# Patient Record
Sex: Female | Born: 1975 | Race: White | Hispanic: No | Marital: Single | Smoking: Never smoker
Health system: Southern US, Community
[De-identification: ages and names within clinical notes are randomized; demographics above are authoritative.]

## PROBLEM LIST (undated history)

## (undated) DIAGNOSIS — F319 Bipolar disorder, unspecified: Secondary | ICD-10-CM

## (undated) DIAGNOSIS — F909 Attention-deficit hyperactivity disorder, unspecified type: Secondary | ICD-10-CM

## (undated) DIAGNOSIS — F429 Obsessive-compulsive disorder, unspecified: Secondary | ICD-10-CM

## (undated) DIAGNOSIS — J45909 Unspecified asthma, uncomplicated: Secondary | ICD-10-CM

## (undated) HISTORY — DX: Attention-deficit hyperactivity disorder, unspecified type: F90.9

---

## 2013-08-23 ENCOUNTER — Encounter (INDEPENDENT_AMBULATORY_CARE_PROVIDER_SITE_OTHER): Payer: Self-pay

## 2013-08-23 ENCOUNTER — Ambulatory Visit (INDEPENDENT_AMBULATORY_CARE_PROVIDER_SITE_OTHER): Payer: Medicaid Other | Admitting: Nurse Practitioner

## 2013-08-23 ENCOUNTER — Encounter: Payer: Self-pay | Admitting: Nurse Practitioner

## 2013-08-23 VITALS — BP 116/77 | HR 84 | Temp 97.3°F | Ht 62.0 in | Wt 185.0 lb

## 2013-08-23 DIAGNOSIS — F3162 Bipolar disorder, current episode mixed, moderate: Secondary | ICD-10-CM | POA: Insufficient documentation

## 2013-08-23 DIAGNOSIS — F902 Attention-deficit hyperactivity disorder, combined type: Secondary | ICD-10-CM

## 2013-08-23 DIAGNOSIS — F909 Attention-deficit hyperactivity disorder, unspecified type: Secondary | ICD-10-CM

## 2013-08-23 MED ORDER — LISDEXAMFETAMINE DIMESYLATE 40 MG PO CAPS: 40.0000 mg | ORAL_CAPSULE | ORAL | Status: DC

## 2013-08-23 NOTE — Progress Notes (Signed)
   Subjective:    Patient ID: Ellen Walter, female    DOB: 07-Apr-1976, 37 y.o.   MRN: [id number]  HPI Patient here to establish care- She has moved here from [place]- Was on ritalin 20mg  BID- SHe says that she has been on it for about 10 years- during that time she has been off of it a couple of times. Does much better when on meds. Old records have been reviewed.    Review of Systems  Constitutional: Negative.   HENT: Negative.   Respiratory: Negative.   Cardiovascular: Negative.   Gastrointestinal: Negative.   Psychiatric/Behavioral: The patient is hyperactive. The patient is not nervous/anxious.   All other systems reviewed and are negative.       Objective:   Physical Exam  Constitutional: She is oriented to person, place, and time. She appears well-developed and well-nourished.  Cardiovascular: Normal rate, regular rhythm and normal heart sounds.   Pulmonary/Chest: Effort normal and breath sounds normal.  Neurological: She is alert and oriented to person, place, and time.  Psychiatric: She has a normal mood and affect. Her behavior is normal. Judgment and thought content normal.   BP 116/77  Pulse 84  Temp(Src) 97.3 F (36.3 C) (Oral)  Ht 5\' 2"  (1.575 m)  Wt 185 lb (83.915 kg)  BMI 33.83 kg/m2  LMP 06/19/2013        Assessment & Plan:   1. ADHD (attention deficit hyperactivity disorder), combined type    Meds ordered this encounter  Medications  . DISCONTD: methylphenidate (RITALIN) 20 MG tablet    Sig: Take 20 mg by mouth 2 (two) times daily with breakfast and lunch.  . lisdexamfetamine (VYVANSE) 40 MG capsule    Sig: Take 1 capsule (40 mg total) by mouth every morning.    Dispense:  30 capsule    Refill:  0    Order Specific Question:  Supervising Provider    Answer:  Deborra Medina   Vyvanse much safer drug for adult Stress management Follow up in 1 month  Ellen Daphine Deutscher, FNP

## 2013-08-23 NOTE — Patient Instructions (Signed)
Attention Deficit Hyperactivity Disorder Attention deficit hyperactivity disorder (ADHD) is a problem with behavior issues based on the way the brain functions (neurobehavioral disorder). It is a common reason for behavior and academic problems in school. CAUSES  The cause of ADHD is unknown in most cases. It may run in families. It sometimes can be associated with learning disabilities and other behavioral problems. SYMPTOMS  There are 3 types of ADHD. The 3 types and some of the symptoms include:  Inattentive  Gets bored or distracted easily.  Loses or forgets things. Forgets to hand in homework.  Has trouble organizing or completing tasks.  Difficulty staying on task.  An inability to organize daily tasks and school work.  Leaving projects, chores, or homework unfinished.  Trouble paying attention or responding to details. Careless mistakes.  Difficulty following directions. Often seems like is not listening.  Dislikes activities that require sustained attention (like chores or homework).  Hyperactive-impulsive  Feels like it is impossible to sit still or stay in a seat. Fidgeting with hands and feet.  Trouble waiting turn.  Talking too much or out of turn. Interruptive.  Speaks or acts impulsively.  Aggressive, disruptive behavior.  Constantly busy or on the go, noisy.  Combined  Has symptoms of both of the above. Often children with ADHD feel discouraged about themselves and with school. They often perform well below their abilities in school. These symptoms can cause problems in home, school, and in relationships with peers. As children get older, the excess motor activities can calm down, but the problems with paying attention and staying organized persist. Most children do not outgrow ADHD but with good treatment can learn to cope with the symptoms. DIAGNOSIS  When ADHD is suspected, the diagnosis should be made by professionals trained in ADHD.  Diagnosis will  include:  Ruling out other reasons for the child's behavior.  The caregivers will check with the child's school and check their medical records.  They will talk to teachers and parents.  Behavior rating scales for the child will be filled out by those dealing with the child on a daily basis. A diagnosis is made only after all information has been considered. TREATMENT  Treatment usually includes behavioral treatment often along with medicines. It may include stimulant medicines. The stimulant medicines decrease impulsivity and hyperactivity and increase attention. Other medicines used include antidepressants and certain blood pressure medicines. Most experts agree that treatment for ADHD should address all aspects of the child's functioning. Treatment should not be limited to the use of medicines alone. Treatment should include structured classroom management. The parents must receive education to address rewarding good behavior, discipline, and limit-setting. Tutoring or behavioral therapy or both should be available for the child. If untreated, the disorder can have long-term serious effects into adolescence and adulthood. HOME CARE INSTRUCTIONS   Often with ADHD there is a lot of frustration among the family in dealing with the illness. There is often blame and anger that is not warranted. This is a life long illness. There is no way to prevent ADHD. In many cases, because the problem affects the family as a whole, the entire family may need help. A therapist can help the family find better ways to handle the disruptive behaviors and promote change. If the child is young, most of the therapist's work is with the parents. Parents will learn techniques for coping with and improving their child's behavior. Sometimes only the child with the ADHD needs counseling. Your caregivers can help   you make these decisions.  Children with ADHD may need help in organizing. Some helpful tips include:  Keep  routines the same every day from wake-up time to bedtime. Schedule everything. This includes homework and playtime. This should include outdoor and indoor recreation. Keep the schedule on the refrigerator or a bulletin board where it is frequently seen. Mark schedule changes as far in advance as possible.  Have a place for everything and keep everything in its place. This includes clothing, backpacks, and school supplies.  Encourage writing down assignments and bringing home needed books.  Offer your child a well-balanced diet. Breakfast is especially important for school performance. Children should avoid drinks with caffeine including:  Soft drinks.  Coffee.  Tea.  However, some older children (adolescents) may find these drinks helpful in improving their attention.  Children with ADHD need consistent rules that they can understand and follow. If rules are followed, give small rewards. Children with ADHD often receive, and expect, criticism. Look for good behavior and praise it. Set realistic goals. Give clear instructions. Look for activities that can foster success and self-esteem. Make time for pleasant activities with your child. Give lots of affection.  Parents are their children's greatest advocates. Learn as much as possible about ADHD. This helps you become a stronger and better advocate for your child. It also helps you educate your child's teachers and instructors if they feel inadequate in these areas. Parent support groups are often helpful. A national group with local chapters is called CHADD (Children and Adults with Attention Deficit Hyperactivity Disorder). PROGNOSIS  There is no cure for ADHD. Children with the disorder seldom outgrow it. Many find adaptive ways to accommodate the ADHD as they mature. SEEK MEDICAL CARE IF:  Your child has repeated muscle twitches, cough or speech outbursts.  Your child has sleep problems.  Your child has a marked loss of  appetite.  Your child develops depression.  Your child has new or worsening behavioral problems.  Your child develops dizziness.  Your child has a racing heart.  Your child has stomach pains.  Your child develops headaches. Document Released: 08/14/2002 Document Revised: 11/16/2011 Document Reviewed: 03/15/2013 [employer] Patient Information 2014 [employer].  

## 2013-09-25 ENCOUNTER — Ambulatory Visit: Payer: Medicaid Other | Admitting: Nurse Practitioner

## 2013-10-16 ENCOUNTER — Encounter: Payer: Self-pay | Admitting: Nurse Practitioner

## 2013-10-16 ENCOUNTER — Ambulatory Visit (INDEPENDENT_AMBULATORY_CARE_PROVIDER_SITE_OTHER): Payer: Medicaid Other | Admitting: Nurse Practitioner

## 2013-10-16 VITALS — BP 137/91 | HR 81 | Temp 97.6°F | Ht 62.0 in | Wt 190.0 lb

## 2013-10-16 DIAGNOSIS — F902 Attention-deficit hyperactivity disorder, combined type: Secondary | ICD-10-CM

## 2013-10-16 DIAGNOSIS — S139XXA Sprain of joints and ligaments of unspecified parts of neck, initial encounter: Secondary | ICD-10-CM

## 2013-10-16 DIAGNOSIS — S161XXA Strain of muscle, fascia and tendon at neck level, initial encounter: Secondary | ICD-10-CM

## 2013-10-16 DIAGNOSIS — F909 Attention-deficit hyperactivity disorder, unspecified type: Secondary | ICD-10-CM

## 2013-10-16 NOTE — Patient Instructions (Signed)
Cervical Sprain A cervical sprain is an injury in the neck in which the strong, fibrous tissues (ligaments) that connect your neck bones stretch or tear. Cervical sprains can range from mild to severe. Severe cervical sprains can cause the neck vertebrae to be unstable. This can lead to damage of the spinal cord and can result in serious nervous system problems. The amount of time it takes for a cervical sprain to get better depends on the cause and extent of the injury. Most cervical sprains heal in 1 to 3 weeks. CAUSES  Severe cervical sprains may be caused by:   Contact sport injuries (such as from football, rugby, wrestling, hockey, auto racing, gymnastics, diving, martial arts, or boxing).   Motor vehicle collisions.   Whiplash injuries. This is an injury from a sudden forward-and backward whipping movement of the head and neck.  Falls.  Mild cervical sprains may be caused by:   Being in an awkward position, such as while cradling a telephone between your ear and shoulder.   Sitting in a chair that does not offer proper support.   Working at a poorly designed computer station.   Looking up or down for long periods of time.  SYMPTOMS   Pain, soreness, stiffness, or a burning sensation in the front, back, or sides of the neck. This discomfort may develop immediately after the injury or slowly, 24 hours or more after the injury.   Pain or tenderness directly in the middle of the back of the neck.   Shoulder or upper back pain.   Limited ability to move the neck.   Headache.   Dizziness.   Weakness, numbness, or tingling in the hands or arms.   Muscle spasms.   Difficulty swallowing or chewing.   Tenderness and swelling of the neck.  DIAGNOSIS  Most of the time your health care provider can diagnose a cervical sprain by taking your history and doing a physical exam. Your health care provider will ask about previous neck injuries and any known neck  problems, such as arthritis in the neck. X-rays may be taken to find out if there are any other problems, such as with the bones of the neck. Other tests, such as a CT scan or MRI, may also be needed.  TREATMENT  Treatment depends on the severity of the cervical sprain. Mild sprains can be treated with rest, keeping the neck in place (immobilization), and pain medicines. Severe cervical sprains are immediately immobilized. Further treatment is done to help with pain, muscle spasms, and other symptoms and may include:  Medicines, such as pain relievers, numbing medicines, or muscle relaxants.   Physical therapy. This may involve stretching exercises, strengthening exercises, and posture training. Exercises and improved posture can help stabilize the neck, strengthen muscles, and help stop symptoms from returning.  HOME CARE INSTRUCTIONS   Put ice on the injured area.   Put ice in a plastic bag.   Place a towel between your skin and the bag.   Leave the ice on for 15 20 minutes, 3 4 times a day.   If your injury was severe, you may have been given a cervical collar to wear. A cervical collar is a two-piece collar designed to keep your neck from moving while it heals.  Do not remove the collar unless instructed by your health care provider.  If you have long hair, keep it outside of the collar.  Ask your health care provider before making any adjustments to your collar.   Minor adjustments may be required over time to improve comfort and reduce pressure on your chin or on the back of your head.  Ifyou are allowed to remove the collar for cleaning or bathing, follow your health care provider's instructions on how to do so safely.  Keep your collar clean by wiping it with mild soap and water and drying it completely. If the collar you have been given includes removable pads, remove them every 1 2 days and hand wash them with soap and water. Allow them to air dry. They should be completely  dry before you wear them in the collar.  If you are allowed to remove the collar for cleaning and bathing, wash and dry the skin of your neck. Check your skin for irritation or sores. If you see any, tell your health care provider.  Do not drive while wearing the collar.   Only take over-the-counter or prescription medicines for pain, discomfort, or fever as directed by your health care provider.   Keep all follow-up appointments as directed by your health care provider.   Keep all physical therapy appointments as directed by your health care provider.   Make any needed adjustments to your workstation to promote good posture.   Avoid positions and activities that make your symptoms worse.   Warm up and stretch before being active to help prevent problems.  SEEK MEDICAL CARE IF:   Your pain is not controlled with medicine.   You are unable to decrease your pain medicine over time as planned.   Your activity level is not improving as expected.  SEEK IMMEDIATE MEDICAL CARE IF:   You develop any bleeding.  You develop stomach upset.  You have signs of an allergic reaction to your medicine.   Your symptoms get worse.   You develop new, unexplained symptoms.   You have numbness, tingling, weakness, or paralysis in any part of your body.  MAKE SURE YOU:   Understand these instructions.  Will watch your condition.  Will get help right away if you are not doing well or get worse. Document Released: 06/21/2007 Document Revised: 06/14/2013 Document Reviewed: 03/01/2013 [employer] Patient Information 2014 [employer].  
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
## 2014-02-08 ENCOUNTER — Emergency Department (HOSPITAL_COMMUNITY)
Admission: EM | Admit: 2014-02-08 | Discharge: 2014-02-08 | Disposition: A | Discharge status: Home / Self Care | Payer: Medicaid Other | Attending: Emergency Medicine | Admitting: Emergency Medicine

## 2014-02-08 ENCOUNTER — Encounter (HOSPITAL_COMMUNITY): Payer: Self-pay | Admitting: Emergency Medicine

## 2014-02-08 DIAGNOSIS — G8929 Other chronic pain: Secondary | ICD-10-CM | POA: Insufficient documentation

## 2014-02-08 DIAGNOSIS — J45901 Unspecified asthma with (acute) exacerbation: Secondary | ICD-10-CM | POA: Insufficient documentation

## 2014-02-08 DIAGNOSIS — Z8659 Personal history of other mental and behavioral disorders: Secondary | ICD-10-CM | POA: Insufficient documentation

## 2014-02-08 DIAGNOSIS — R519 Headache, unspecified: Secondary | ICD-10-CM

## 2014-02-08 DIAGNOSIS — R51 Headache: Secondary | ICD-10-CM | POA: Insufficient documentation

## 2014-02-08 DIAGNOSIS — Z760 Encounter for issue of repeat prescription: Secondary | ICD-10-CM | POA: Insufficient documentation

## 2014-02-08 HISTORY — DX: Unspecified asthma, uncomplicated: J45.909

## 2014-02-08 MED ORDER — ALBUTEROL SULFATE HFA 108 (90 BASE) MCG/ACT IN AERS: 1.0000 | INHALATION_SPRAY | Freq: Four times a day (QID) | RESPIRATORY_TRACT | Status: AC | PRN

## 2014-02-08 MED ORDER — ACETAMINOPHEN 325 MG PO TABS
650.0000 mg | ORAL_TABLET | Freq: Once | ORAL | Status: AC
  Administered 2014-02-08: 650 mg via ORAL
  Filled 2014-02-08: qty 2

## 2014-02-08 MED ORDER — BUTALBITAL-APAP-CAFFEINE 50-325-40 MG PO TABS: 1.0000 | ORAL_TABLET | Freq: Four times a day (QID) | ORAL | Status: DC | PRN

## 2014-02-08 MED ORDER — ALBUTEROL SULFATE HFA 108 (90 BASE) MCG/ACT IN AERS
2.0000 | INHALATION_SPRAY | RESPIRATORY_TRACT | Status: DC | PRN
  Administered 2014-02-08: 2 via RESPIRATORY_TRACT
  Filled 2014-02-08: qty 6.7

## 2014-02-08 NOTE — ED Notes (Signed)
PT now reports she has had a H/A for days and days with a pain scale of 10/10. Pt denies injury or fall. Pt A/O x4.

## 2014-02-08 NOTE — ED Notes (Signed)
Pt reports having asthma, increase in sob x 3 days and pt is out of inhalers. Airway intact at triage, speaking in full sentences.

## 2014-02-08 NOTE — Discharge Instructions (Signed)
Headaches, Frequently Asked Questions °MIGRAINE HEADACHES °Q: What is migraine? What causes it? How can I treat it? °A: Generally, migraine headaches begin as a dull ache. Then they develop into a constant, throbbing, and pulsating pain. You may experience pain at the temples. You may experience pain at the front or back of one or both sides of the head. The pain is usually accompanied by a combination of: °· Nausea. °· Vomiting. °· Sensitivity to light and noise. °Some people (about 15%) experience an aura (see below) before an attack. The cause of migraine is believed to be chemical reactions in the brain. Treatment for migraine may include over-the-counter or prescription medications. It may also include self-help techniques. These include relaxation training and biofeedback.  °Q: What is an aura? °A: About 15% of people with migraine get an "aura". This is a sign of neurological symptoms that occur before a migraine headache. You may see wavy or jagged lines, dots, or flashing lights. You might experience tunnel vision or blind spots in one or both eyes. The aura can include visual or auditory hallucinations (something imagined). It may include disruptions in smell (such as strange odors), taste or touch. Other symptoms include: °· Numbness. °· A "pins and needles" sensation. °· Difficulty in recalling or speaking the correct word. °These neurological events may last as long as 60 minutes. These symptoms will fade as the headache begins. °Q: What is a trigger? °A: Certain physical or environmental factors can lead to or "trigger" a migraine. These include: °· Foods. °· Hormonal changes. °· Weather. °· Stress. °It is important to remember that triggers are different for everyone. To help prevent migraine attacks, you need to figure out which triggers affect you. Keep a headache diary. This is a good way to track triggers. The diary will help you talk to your healthcare professional about your condition. °Q: Does  weather affect migraines? °A: Bright sunshine, hot, humid conditions, and drastic changes in barometric pressure may lead to, or "trigger," a migraine attack in some people. But studies have shown that weather does not act as a trigger for everyone with migraines. °Q: What is the link between migraine and hormones? °A: Hormones start and regulate many of your body's functions. Hormones keep your body in balance within a constantly changing environment. The levels of hormones in your body are unbalanced at times. Examples are during menstruation, pregnancy, or menopause. That can lead to a migraine attack. In fact, about three quarters of all women with migraine report that their attacks are related to the menstrual cycle.  °Q: Is there an increased risk of stroke for migraine sufferers? °A: The likelihood of a migraine attack causing a stroke is very remote. That is not to say that migraine sufferers cannot have a stroke associated with their migraines. In persons under age 40, the most common associated factor for stroke is migraine headache. But over the course of a person's normal life span, the occurrence of migraine headache may actually be associated with a reduced risk of dying from cerebrovascular disease due to stroke.  °Q: What are acute medications for migraine? °A: Acute medications are used to treat the pain of the headache after it has started. Examples over-the-counter medications, NSAIDs, ergots, and triptans.  °Q: What are the triptans? °A: Triptans are the newest class of abortive medications. They are specifically targeted to treat migraine. Triptans are vasoconstrictors. They moderate some chemical reactions in the brain. The triptans work on receptors in your brain. Triptans help   to restore the balance of a neurotransmitter called serotonin. Fluctuations in levels of serotonin are thought to be a main cause of migraine.  °Q: Are over-the-counter medications for migraine effective? °A:  Over-the-counter, or "OTC," medications may be effective in relieving mild to moderate pain and associated symptoms of migraine. But you should see your caregiver before beginning any treatment regimen for migraine.  °Q: What are preventive medications for migraine? °A: Preventive medications for migraine are sometimes referred to as "prophylactic" treatments. They are used to reduce the frequency, severity, and length of migraine attacks. Examples of preventive medications include antiepileptic medications, antidepressants, beta-blockers, calcium channel blockers, and NSAIDs (nonsteroidal anti-inflammatory drugs). °Q: Why are anticonvulsants used to treat migraine? °A: During the past few years, there has been an increased interest in antiepileptic drugs for the prevention of migraine. They are sometimes referred to as "anticonvulsants". Both epilepsy and migraine may be caused by similar reactions in the brain.  °Q: Why are antidepressants used to treat migraine? °A: Antidepressants are typically used to treat people with depression. They may reduce migraine frequency by regulating chemical levels, such as serotonin, in the brain.  °Q: What alternative therapies are used to treat migraine? °A: The term "alternative therapies" is often used to describe treatments considered outside the scope of conventional Western medicine. Examples of alternative therapy include acupuncture, acupressure, and yoga. Another common alternative treatment is herbal therapy. Some herbs are believed to relieve headache pain. Always discuss alternative therapies with your caregiver before proceeding. Some herbal products contain arsenic and other toxins. °TENSION HEADACHES °Q: What is a tension-type headache? What causes it? How can I treat it? °A: Tension-type headaches occur randomly. They are often the result of temporary stress, anxiety, fatigue, or anger. Symptoms include soreness in your temples, a tightening band-like sensation  around your head (a "vice-like" ache). Symptoms can also include a pulling feeling, pressure sensations, and contracting head and neck muscles. The headache begins in your forehead, temples, or the back of your head and neck. Treatment for tension-type headache may include over-the-counter or prescription medications. Treatment may also include self-help techniques such as relaxation training and biofeedback. °CLUSTER HEADACHES °Q: What is a cluster headache? What causes it? How can I treat it? °A: Cluster headache gets its name because the attacks come in groups. The pain arrives with little, if any, warning. It is usually on one side of the head. A tearing or bloodshot eye and a runny nose on the same side of the headache may also accompany the pain. Cluster headaches are believed to be caused by chemical reactions in the brain. They have been described as the most severe and intense of any headache type. Treatment for cluster headache includes prescription medication and oxygen. °SINUS HEADACHES °Q: What is a sinus headache? What causes it? How can I treat it? °A: When a cavity in the bones of the face and skull (a sinus) becomes inflamed, the inflammation will cause localized pain. This condition is usually the result of an allergic reaction, a tumor, or an infection. If your headache is caused by a sinus blockage, such as an infection, you will probably have a fever. An x-ray will confirm a sinus blockage. Your caregiver's treatment might include antibiotics for the infection, as well as antihistamines or decongestants.  °REBOUND HEADACHES °Q: What is a rebound headache? What causes it? How can I treat it? °A: A pattern of taking acute headache medications too often can lead to a condition known as "rebound headache."   A pattern of taking too much headache medication includes taking it more than 2 days per week or in excessive amounts. That means more than the label or a caregiver advises. With rebound  headaches, your medications not only stop relieving pain, they actually begin to cause headaches. Doctors treat rebound headache by tapering the medication that is being overused. Sometimes your caregiver will gradually substitute a different type of treatment or medication. Stopping may be a challenge. Regularly overusing a medication increases the potential for serious side effects. Consult a caregiver if you regularly use headache medications more than 2 days per week or more than the label advises. °ADDITIONAL QUESTIONS AND ANSWERS °Q: What is biofeedback? °A: Biofeedback is a self-help treatment. Biofeedback uses special equipment to monitor your body's involuntary physical responses. Biofeedback monitors: °· Breathing. °· Pulse. °· Heart rate. °· Temperature. °· Muscle tension. °· Brain activity. °Biofeedback helps you refine and perfect your relaxation exercises. You learn to control the physical responses that are related to stress. Once the technique has been mastered, you do not need the equipment any more. °Q: Are headaches hereditary? °A: Four out of five (80%) of people that suffer report a family history of migraine. Scientists are not sure if this is genetic or a family predisposition. Despite the uncertainty, a child has a 50% chance of having migraine if one parent suffers. The child has a 75% chance if both parents suffer.  °Q: Can children get headaches? °A: By the time they reach high school, most young people have experienced some type of headache. Many safe and effective approaches or medications can prevent a headache from occurring or stop it after it has begun.  °Q: What type of doctor should I see to diagnose and treat my headache? °A: Start with your primary caregiver. Discuss his or her experience and approach to headaches. Discuss methods of classification, diagnosis, and treatment. Your caregiver may decide to recommend you to a headache specialist, depending upon your symptoms or other  physical conditions. Having diabetes, allergies, etc., may require a more comprehensive and inclusive approach to your headache. The National Headache Foundation will provide, upon request, a list of NHF physician members in your state. °Document Released: 11/14/2003 Document Revised: 11/16/2011 Document Reviewed: 04/23/2008 °ExitCare® Patient Information ©2014 [employer]. ° ° °Emergency Department Resource Guide °1) Find a Doctor and Pay Out of Pocket °Although you won't have to find out who is covered by your insurance plan, it is a good idea to ask around and get recommendations. You will then need to call the office and see if the doctor you have chosen will accept you as a new patient and what types of options they offer for patients who are self-pay. Some doctors offer discounts or will set up payment plans for their patients who do not have insurance, but you will need to ask so you aren't surprised when you get to your appointment. ° °2) Contact Your Local Health Department °Not all health departments have doctors that can see patients for sick visits, but many do, so it is worth a call to see if yours does. If you don't know where your local health department is, you can check in your phone book. The CDC also has a tool to help you locate your state's health department, and many state websites also have listings of all of their local health departments. ° °3) Find a Walk-in Clinic °If your illness is not likely to be very severe or complicated, you may   want to try a walk in clinic. These are popping up all over the country in pharmacies, drugstores, and shopping centers. They're usually staffed by nurse practitioners or physician assistants that have been trained to treat common illnesses and complaints. They're usually fairly quick and inexpensive. However, if you have serious medical issues or chronic medical problems, these are probably not your best option. ° °No Primary Care Doctor: °- Call Health  Connect at  [phone] - they can help you locate a primary care doctor that  accepts your insurance, provides certain services, etc. °- Physician Referral Service- 1-800-533-3463 ° °Chronic Pain Problems: °Organization         Address  Phone   Notes  °[facility]  (336) 297-2271 Patients need to be referred by their primary care doctor.  ° °Medication Assistance: °Organization         Address  Phone   Notes  °[place] [facility] [address] °[place], [place] [address] (336) 641-8030 --Must be a resident of [place] °-- Must have NO insurance coverage whatsoever (no Medicaid/ Medicare, etc.) °-- The pt. MUST have a primary care doctor that directs their care regularly and follows them in the community °  °[facility]  (866) 331-1348   °[employer]  (888) 892-1162   ° °Agencies that provide inexpensive medical care: °Organization         Address  Phone   Notes  °[facility]  (336) 832-8035   °[facility]    (336) 832-7272   °[facility] [address] °[place], [place] [address] (336) 832-4777   °[facility] [address], °[place] (336) 271-4999   °[facility]    (336) 373-0678   °[facility]    (336) 272-1050   °[facility] ° [address], [place] Phone:  (336) 832-4444, Fax:  (336) 832-4440 Hours of Operation:  9 am - 6 pm, M-F.  Also accepts Medicaid/Medicare and self-pay.  °[facility] ° [address], [place] Phone: (336) 832-3150, Fax: (336) [phone]. Hours of Operation:  8:30 am - 5:30 pm, M-F.  Also accepts Medicaid and self-pay.  °[facility] [place] [address], [place] Phone: (336) 878-6027   °[facility] [address], [place], [place] (336)723-1848, Ext. 123 Mondays & Thursdays: 7-9 AM.  First 15 patients are seen on a first come, first serve basis. °   ° °Medicaid-accepting [place] Providers: ° °Organization         Address  Phone   Notes  °[facility] [address], [place] (336) 641-2100 Also accepts self-pay patients.  °[facility] [address], [place] ° (336) 856-9996   °[facility] [address], [place] (336) 288-8857   °[facility] [address] [place] [address], [place] (336) 299-7000   °Veita Bland [address], [place]  ° (336) 373-1557 Only accepts [place] Access Medicaid patients after they have their name applied to their card.  ° °Self-Pay (no insurance) in [place]: ° °Organization         Address  Phone   Notes  °Sickle Cell Patients, [facility] [address],  (336) 832-1970   °[facility] [address],  (336)   832-4400   °[facility] ° [address], [place] (336) 992-4800   °[facility]/Dr. Osei-Bonsu ° [address] [place] [address], [place] or [address], [place] (336) [phone] Phone number for both [place] and [place] locations is the same.  °[facility] [address], [place] (336) 299-0000   °[facility] [address] [place] [address], [place] or [address] (336) 852-7530 °(336) 878-2260   °[facility] [address], [place] (336) 350-1642, phone; (336) 294-5005, fax Sees patients 1st and 3rd Saturday of every month.  Must not qualify for public or private insurance (i.e. Medicaid, Medicare, [place] Health Choice, Veterans' Benefits) • Household income should be no more than 200% of the poverty level •The clinic cannot treat you if you are pregnant or think you are pregnant • Sexually transmitted diseases are not treated at the clinic.  ° ° °Dental Care: °Organization         Address  Phone  Notes  °[place]  [facility] [address], [place] (336) 641-6152 Accepts children up to age 21 who are enrolled in Medicaid or [place] Health Choice; pregnant women with a Medicaid card; and children who have applied for Medicaid or [place] Health Choice, but were declined, whose parents can pay a reduced fee at time of service.  °[place] [facility] [place]  [address], [place] (336) 641-7733 Accepts children up to age 21 who are enrolled in Medicaid or [place] Health Choice; pregnant women with a Medicaid card; and children who have applied for Medicaid or [place] Health Choice, but were declined, whose parents can pay a reduced fee at time of service.  °[facility] ° [address], [place] (336) 641-4533 Patients are seen by appointment only. Walk-ins are not accepted. [facility] will see patients 18 years of age and older. °Monday - Tuesday (8am-5pm) °Most Wednesdays (8:30-5pm) °$30 per visit, cash only  °[facility] ° [address], [place] (336) 641-4533 Patients are seen by appointment only. Walk-ins are not accepted. [facility] will see patients 18 years of age and older. °One Wednesday Evening (Monthly: Volunteer Based).  $30 per visit, cash only  °[facility]  (919) 537-3737 for adults; Children under age 4, call Graduate Pediatric Dentistry at ([phone]) [phone]. Children aged 4-14, please call ([phone]) [phone] to request a pediatric application. ° Dental services are provided in all areas of dental care including fillings, crowns and bridges, complete and partial dentures, implants, gum treatment, root canals, and extractions. Preventive care is also provided. Treatment is provided to both adults and children. °Patients are selected via a lottery and there is often a waiting list. °  °[facility] [address], °[place] ° (336) 763-8833  [email or web address] °  °[facility] [address], [place], [place] (336)723-1848, Ext. 123 Second and Fourth Thursday of each month, opens at 6:30 AM; Clinic ends at 9 AM.  Patients are seen on a first-come first-served basis, and a limited number are seen during each clinic.  ° °[facility] ° [address], [place],  (336) 723-7904   Eligibility Requirements °You must have lived in [place], [place], or [place] counties for at least the last three months. °  You cannot be eligible for state   or federal sponsored healthcare insurance, including Veterans Administration, Medicaid, or Medicare. °  You generally cannot be eligible for healthcare insurance through your employer.  °  How to apply: °Eligibility screenings are held every Tuesday and Wednesday afternoon from 1:00 pm until 4:00 pm. You do not need an appointment for the interview!  °[facility] [address], [place], [place] 336-631-2330   °[facility] [facility]  336-342-8273   °[facility]  336-703-3100   °[facility]  336-570-6415   ° °Behavioral Health Resources in the Community: °Intensive Outpatient Programs °Organization         Address  Phone  Notes  °[facility] [address], [place], [place] 336-878-6098   °[facility] [address], [place], [place] 336-832-9800   °[facility] [address], [place], [place] ° 336-882-2125   °[facility] [address],  °[place], [place] 1-800-853-5163 or 336-641-4981   °Substance Abuse Resources °Organization         Address  Phone  Notes  °[facility]  336-882-2125   °[facility]  336-784-9470   °[facility]  336-285-9073   °[facility]  336-845-3988   °Residential & Outpatient Substance Abuse Program  1-800-659-3381   °Psychological Services °Organization          Address  Phone  Notes  °[facility]  336- 832-9600   °[facility]  336- 378-7881   °[facility] [address], [place] 1-800-853-5163 or 336-641-4981   ° °Mobile Crisis Teams °Organization         Address  Phone  Notes  °[facility], Mobile Crisis Care Unit  1-877-626-1772   °Assertive °[facility] ° [address] [place], [place] 336-834-9664   °Sharon DeEsch [address] °[place] 336-554-5454   ° °Self-Help/Support Groups °Organization         Address  Phone             Notes  °[facility] [place] - variety of support groups  336- 373-1402 Call for more information  °Narcotics Anonymous (NA), [facility] [address], °[place]  2 meetings at this location  ° °Residential Treatment Programs °Organization         Address  Phone  Notes  °[facility] [address],    °[place]  1-866-801-8205   °[facility] ° [address], [place], [place] 704-293-8524   °[facility] [address], [place] 336-845-3988 Admissions: 8am-3pm M-F  °[facility] [address]    °[place], [place] 336-841-1104   °[facility] [address], [place], [place] 336-379-7146   °[facility] [address]  °[place], [place] 336-285-9073   °[facility] - Intensive Outpatient [address], , [place] 336-852-3033   °[facility] [address]  °[place], [place] 1-877-615-2722 or 336-784-9470   °[facility] [address] [place], [place] 336-227-7417 Accepts Medicaid  °[facility] [address]  ° [place] 1-800-659-3381 Substance Abuse/Addiction Treatment  ° °[place] Behavioral Health Resources °Organization         Address  Phone  Notes  °[facility]  (888) 581-9988   °Julie Brannon, PhD [address] [place],    (  336) 349-5553 or (336) 951-0000   °[facility]   [address] °[place], [place] (336) 349-4454   °[facility] [address], [place], [place] (336) 342-8316 Insurance/Medicaid/sponsorship through [facility]  °[facility] [address]                                    [place], [place] (336) 342-8316 Therapy/tele-psych/case  °[facility] [address]  ° [place], [place] (336) 349-2233    °Dr. Arfeen  (336) 349-4544   °[facility] [place]  [employer] [place] [facility] 1) [address], [place] °2) [address], [place] °3)  [address], [place] (336) 349-3220 °(336) 342-7768 ° °(336) 342-8140   °[place] [facility] (336) 342-1394 or (336) 342-3537 (After Hours)    ° ° ° °

## 2014-02-08 NOTE — ED Provider Notes (Signed)
Medical screening examination/treatment/procedure(s) were performed by non-physician practitioner and as supervising physician I was immediately available for consultation/collaboration.   EKG Interpretation None        Zanyia Silbaugh N Addilee Neu, DO 02/08/14 1536 

## 2014-02-08 NOTE — ED Provider Notes (Signed)
CSN: [id number]     Arrival date & time 02/08/14  1234 History  This chart was scribed for Fayrene Helper PA-C  working with Layla Maw Ward, DO by Ashley Jacobs, ED scribe. This patient was seen in room TR06C/TR06C and the patient's care was started at 2:30 PM.  First MD Initiated Contact with Patient 02/08/14 1241     Chief Complaint  Patient presents with  . Asthma     (Consider location/radiation/quality/duration/timing/severity/associated sxs/prior Treatment) Patient is a 38 y.o. female presenting with asthma. The history is provided by the patient, medical records and a friend. No language interpreter was used.  Asthma This is a chronic problem. The current episode started 2 days ago. The problem occurs constantly. The problem has been gradually worsening. Associated symptoms include headaches and shortness of breath. Nothing relieves the symptoms. She has tried nothing for the symptoms.   HPI Comments: Ellen Walter is a 38 y.o. female  who presents to the Emergency Department complaining of gradually worsening asthma symptoms for the past three days. Pt reports that she used all of her asthma medication one week ago and does not have access to anymore. She uses her inhaler 3-4 times a day or as needed.Pt reports having a headache for the past four days. The head pain feels as though "someone is pushing a butcher knife in my head". Pt reports she typically has 4 headaches that generally lasts each week. She usually uses sleep to go to sleep or having her friend to give her a head message to help the headaches resolve. Pt was given 800 mg of Ibuprofen, tylenol, and a half of percocet.   Pt denies hx of being in the ICU or being intubated. Denies fever. Denies nausea and vomiting. Denies numbness. Denies rash.    Past Medical History  Diagnosis Date  . ADHD (attention deficit hyperactivity disorder)   . Asthma    History reviewed. No pertinent past surgical history. History reviewed. No  pertinent family history. History  Substance Use Topics  . Smoking status: Never Smoker   . Smokeless tobacco: Not on file  . Alcohol Use: No   OB History   Grav Para Term Preterm Abortions TAB SAB Ect Mult Living                 Review of Systems  Constitutional: Negative for fever.  Respiratory: Positive for shortness of breath.   Gastrointestinal: Negative for nausea and vomiting.  Skin: Negative for rash.  Neurological: Positive for headaches. Negative for numbness.  All other systems reviewed and are negative.     Allergies  Simcor  Home Medications   Prior to Admission medications   Not on File   BP 137/78  Pulse 88  Temp(Src) 97.7 F (36.5 C) (Oral)  Resp 22  SpO2 96%  LMP 01/29/2014 Physical Exam  Nursing note and vitals reviewed. Constitutional: She is oriented to person, place, and time. She appears well-developed and well-nourished. No distress.  HENT:  Head: Normocephalic and atraumatic.  Eyes: Conjunctivae and EOM are normal.  Neck: Normal range of motion. No tracheal deviation present.  Cardiovascular: Normal rate.   Pulmonary/Chest: Effort normal. No respiratory distress.  Musculoskeletal: Normal range of motion.  Neurological: She is alert and oriented to person, place, and time.  Skin: Skin is warm and dry.  Psychiatric: She has a normal mood and affect. Her behavior is normal.    ED Course  Procedures (including critical care time) DIAGNOSTIC STUDIES: Oxygen Saturation is  96% on room air, normal by my interpretation.    COORDINATION OF CARE:  2:33 PM Discussed course of care with pt which includes ventolin. Pt understands and agrees.  3:20 PM Headache similar to previous, no fever, neck stiffness, neuro findings or new symptoms to suggest more serious etiology.  I don't think SAH, ICH, meningitis, encephalitis, mass at this time.  No recent trauma.  I don't feel imaging necessary at this time.  Plan to control symptoms.  Pt will also  receive referral to neurology for outpt work up of her headache.  Pt has no active wheezing, albuterol refill and inhaler given. Labs Review Labs Reviewed - No data to display  Imaging Review No results found.   EKG Interpretation None      MDM   Final diagnoses:  Encounter for medication refill  Headache, chronic daily   BP 113/63  Pulse 80  Temp(Src) 97.7 F (36.5 C) (Oral)  Resp 22  SpO2 98%  LMP 01/29/2014   I personally performed the services described in this documentation, which was scribed in my presence. The recorded information has been reviewed and is accurate.     Fayrene HelperBowie Carlie Solorzano, PA-C 02/08/14 1521

## 2014-02-17 ENCOUNTER — Emergency Department (HOSPITAL_COMMUNITY)
Admission: EM | Admit: 2014-02-17 | Discharge: 2014-02-17 | Disposition: A | Discharge status: Home / Self Care | Payer: Medicaid Other | Attending: Emergency Medicine | Admitting: Emergency Medicine

## 2014-02-17 ENCOUNTER — Encounter (HOSPITAL_COMMUNITY): Payer: Self-pay | Admitting: Emergency Medicine

## 2014-02-17 ENCOUNTER — Emergency Department (HOSPITAL_COMMUNITY): Payer: Medicaid Other

## 2014-02-17 DIAGNOSIS — Z3202 Encounter for pregnancy test, result negative: Secondary | ICD-10-CM | POA: Insufficient documentation

## 2014-02-17 DIAGNOSIS — J45909 Unspecified asthma, uncomplicated: Secondary | ICD-10-CM | POA: Insufficient documentation

## 2014-02-17 DIAGNOSIS — R519 Headache, unspecified: Secondary | ICD-10-CM

## 2014-02-17 DIAGNOSIS — R51 Headache: Secondary | ICD-10-CM | POA: Insufficient documentation

## 2014-02-17 DIAGNOSIS — R209 Unspecified disturbances of skin sensation: Secondary | ICD-10-CM | POA: Insufficient documentation

## 2014-02-17 DIAGNOSIS — Z8659 Personal history of other mental and behavioral disorders: Secondary | ICD-10-CM | POA: Insufficient documentation

## 2014-02-17 DIAGNOSIS — H53149 Visual discomfort, unspecified: Secondary | ICD-10-CM | POA: Insufficient documentation

## 2014-02-17 HISTORY — DX: Obsessive-compulsive disorder, unspecified: F42.9

## 2014-02-17 HISTORY — DX: Bipolar disorder, unspecified: F31.9

## 2014-02-17 LAB — CBC WITH DIFFERENTIAL/PLATELET
Basophils Absolute: 0 10*3/uL (ref 0.0–0.1)
Basophils Relative: 0 % (ref 0–1)
EOS ABS: 0.4 10*3/uL (ref 0.0–0.7)
Eosinophils Relative: 3 % (ref 0–5)
HCT: 33.8 % — ABNORMAL LOW (ref 36.0–46.0)
Hemoglobin: 11.3 g/dL — ABNORMAL LOW (ref 12.0–15.0)
LYMPHS ABS: 2.8 10*3/uL (ref 0.7–4.0)
LYMPHS PCT: 25 % (ref 12–46)
MCH: 31.2 pg (ref 26.0–34.0)
MCHC: 33.4 g/dL (ref 30.0–36.0)
MCV: 93.4 fL (ref 78.0–100.0)
MONOS PCT: 4 % (ref 3–12)
Monocytes Absolute: 0.5 10*3/uL (ref 0.1–1.0)
NEUTROS PCT: 68 % (ref 43–77)
Neutro Abs: 7.7 10*3/uL (ref 1.7–7.7)
Platelets: 257 10*3/uL (ref 150–400)
RBC: 3.62 MIL/uL — AB (ref 3.87–5.11)
RDW: 12.6 % (ref 11.5–15.5)
WBC: 11.3 10*3/uL — AB (ref 4.0–10.5)

## 2014-02-17 LAB — URINALYSIS, ROUTINE W REFLEX MICROSCOPIC
Bilirubin Urine: NEGATIVE
GLUCOSE, UA: NEGATIVE mg/dL
HGB URINE DIPSTICK: NEGATIVE
Ketones, ur: NEGATIVE mg/dL
LEUKOCYTES UA: NEGATIVE
Nitrite: NEGATIVE
PH: 6 (ref 5.0–8.0)
Protein, ur: NEGATIVE mg/dL
SPECIFIC GRAVITY, URINE: 1.024 (ref 1.005–1.030)
Urobilinogen, UA: 1 mg/dL (ref 0.0–1.0)

## 2014-02-17 LAB — BASIC METABOLIC PANEL
BUN: 12 mg/dL (ref 6–23)
CHLORIDE: 104 meq/L (ref 96–112)
CO2: 26 meq/L (ref 19–32)
Calcium: 8.6 mg/dL (ref 8.4–10.5)
Creatinine, Ser: 0.62 mg/dL (ref 0.50–1.10)
GFR calc Af Amer: >90 mL/min (ref >90)
GLUCOSE: 89 mg/dL (ref 70–99)
POTASSIUM: 4 meq/L (ref 3.7–5.3)
Sodium: 143 mEq/L (ref 137–147)

## 2014-02-17 LAB — PREGNANCY, URINE: Preg Test, Ur: NEGATIVE

## 2014-02-17 MED ORDER — SODIUM CHLORIDE 0.9 % IV BOLUS (SEPSIS)
1000.0000 mL | Freq: Once | INTRAVENOUS | Status: AC
  Administered 2014-02-17: 1000 mL via INTRAVENOUS

## 2014-02-17 MED ORDER — DIPHENHYDRAMINE HCL 50 MG/ML IJ SOLN
25.0000 mg | Freq: Once | INTRAMUSCULAR | Status: AC
  Administered 2014-02-17: 25 mg via INTRAVENOUS
  Filled 2014-02-17: qty 1

## 2014-02-17 MED ORDER — METOCLOPRAMIDE HCL 5 MG/ML IJ SOLN
10.0000 mg | Freq: Once | INTRAMUSCULAR | Status: AC
Start: 2014-02-17 — End: 2014-02-17
  Administered 2014-02-17: 10 mg via INTRAVENOUS
  Filled 2014-02-17: qty 2

## 2014-02-17 NOTE — ED Notes (Signed)
Pt given education and information about following up with primary care

## 2014-02-17 NOTE — ED Provider Notes (Signed)
CSN: [id number]     Arrival date & time 02/17/14  1724 History   None    Chief Complaint  Patient presents with  . Headache   HPI  Ellen Walter is a 38 y.o. female with a PMH of ADHD, asthma, bipolar disorder, and OCD who presents to the ED for evaluation of a headache. History was provided by the patient. Patient states she has had a constant headache for the past 6 months. Headache is located generally throughout her scalp and behind her eyes bilaterally. She describes a stabbing pain. Intermittent photophobia and blurry vision. She has tried "everything" for her headache which includes Tylenol, Fioricet, Ibuprofen to name a few with no relief. She was seen in the ED for asthma on 02/08/14 and mentioned her headache at that time but did not follow-up regarding this. She occasionally has numbness and tingling in her hands bilaterally. She also has not been able to sleep due to her headaches. She denies any dx of migraines, head CT or neurology evaluation in the past. She denies any head trauma or injuries. No fever, chills, change in appetite/activity, rhinorrhea, congestion, sore throat, chest pain, SOB, dizziness, weakness, loss of sensation, abdominal pain, nausea, emesis, diarrhea, or dysuria. She states that sometimes she feels lightheaded from her headaches but she denies this currently.    Past Medical History  Diagnosis Date  . ADHD (attention deficit hyperactivity disorder)   . Asthma   . Bipolar disorder   . OCD (obsessive compulsive disorder)    History reviewed. No pertinent past surgical history. No family history on file. History  Substance Use Topics  . Smoking status: Never Smoker   . Smokeless tobacco: Not on file  . Alcohol Use: No   OB History   Grav Para Term Preterm Abortions TAB SAB Ect Mult Living                  Review of Systems  Constitutional: Negative for fever, chills, activity change, appetite change and fatigue.  HENT: Negative for congestion,  rhinorrhea and sore throat.   Eyes: Positive for photophobia, pain and visual disturbance. Negative for discharge and redness.  Respiratory: Negative for cough and shortness of breath.   Cardiovascular: Negative for chest pain and leg swelling.  Gastrointestinal: Negative for nausea, vomiting, abdominal pain and diarrhea.  Genitourinary: Negative for dysuria and difficulty urinating.  Musculoskeletal: Negative for back pain, neck pain and neck stiffness.  Neurological: Positive for light-headedness, numbness and headaches. Negative for dizziness, syncope, facial asymmetry, speech difficulty and weakness.  Psychiatric/Behavioral: Negative for confusion.    Allergies  Simcor  Home Medications   Prior to Admission medications   Medication Sig Start Date End Date Taking? Authorizing Provider  albuterol (PROVENTIL HFA;VENTOLIN HFA) 108 (90 BASE) MCG/ACT inhaler Inhale 1-2 puffs into the lungs every 6 (six) hours as needed for wheezing or shortness of breath. 02/08/14   Fayrene Helper, PA-C  butalbital-acetaminophen-caffeine (FIORICET) [phone] MG per tablet Take 1-2 tablets by mouth every 6 (six) hours as needed for headache. 02/08/14 02/08/15  Fayrene Helper, PA-C   BP 133/89  Pulse 96  Temp(Src) 98.1 F (36.7 C) (Oral)  Resp 18  SpO2 98%  LMP 01/29/2014  Filed Vitals:   02/17/14 1930 02/17/14 2054 02/17/14 2100 02/17/14 2115  BP: 126/91 154/97 131/85 133/82  Pulse: 84 86 86 85  Temp:      TempSrc:      Resp: 18 17 21 19   SpO2: 98%  97% 98%  Physical Exam  Nursing note and vitals reviewed. Constitutional: She is oriented to person, place, and time. She appears well-developed and well-nourished. No distress.  HENT:  Head: Normocephalic and atraumatic.  Right Ear: External ear normal.  Left Ear: External ear normal.  Nose: Nose normal.  Mouth/Throat: Oropharynx is clear and moist. No oropharyngeal exudate.  No tenderness to the scalp or face throughout. No palpable hematoma, step-offs,  or lacerations throughout.  Tympanic membranes gray and translucent bilaterally.    Eyes: Conjunctivae and EOM are normal. Pupils are equal, round, and reactive to light. Right eye exhibits no discharge. Left eye exhibits no discharge.  Neck: Normal range of motion. Neck supple.  No cervical lymphadenopathy. No nuchal rigidity.   Cardiovascular: Normal rate and regular rhythm.  Exam reveals no gallop and no friction rub.   No murmur heard. Pulmonary/Chest: Effort normal and breath sounds normal. No respiratory distress. She has no wheezes. She has no rales. She exhibits no tenderness.  Abdominal: Soft. She exhibits no distension. There is no tenderness.  Musculoskeletal: Normal range of motion. She exhibits no edema and no tenderness.  Strength 5/5 in the upper and lower extremities bilaterally. Patient able to ambulate without difficulty or ataxia  Neurological: She is alert and oriented to person, place, and time.  GCS 15. No focal neurological deficits. CN 2-12 intact. No pronator drift. Finger to nose intact. Heel to shin intact.    Skin: Skin is warm and dry. She is not diaphoretic.    ED Course  Procedures (including critical care time) Labs Review Labs Reviewed - No data to display  Imaging Review Ct Head Wo Contrast  02/17/2014   CLINICAL DATA:  Chronic headaches x6 months  EXAM: CT HEAD WITHOUT CONTRAST  TECHNIQUE: Contiguous axial images were obtained from the base of the skull through the vertex without intravenous contrast.  COMPARISON:  None.  FINDINGS: No evidence of parenchymal hemorrhage or extra-axial fluid collection. No mass lesion, mass effect, or midline shift.  No CT evidence of acute infarction.  Cerebral volume is within normal limits.  No ventriculomegaly.  The visualized paranasal sinuses are essentially clear. The mastoid air cells are unopacified.  No evidence of calvarial fracture.  IMPRESSION: Normal head CT.   Electronically Signed   By: Charline BillsSriyesh  Krishnan M.D.    On: 02/17/2014 19:55     EKG Interpretation None      Results for orders placed during the hospital encounter of 02/17/14  CBC WITH DIFFERENTIAL      Result Value Ref Range   WBC 11.3 (*) 4.0 - 10.5 K/uL   RBC 3.62 (*) 3.87 - 5.11 MIL/uL   Hemoglobin 11.3 (*) 12.0 - 15.0 g/dL   HCT 16.133.8 (*) 09.636.0 - 04.546.0 %   MCV 93.4  78.0 - 100.0 fL   MCH 31.2  26.0 - 34.0 pg   MCHC 33.4  30.0 - 36.0 g/dL   RDW 40.912.6  81.111.5 - 91.415.5 %   Platelets 257  150 - 400 K/uL   Neutrophils Relative % 68  43 - 77 %   Neutro Abs 7.7  1.7 - 7.7 K/uL   Lymphocytes Relative 25  12 - 46 %   Lymphs Abs 2.8  0.7 - 4.0 K/uL   Monocytes Relative 4  3 - 12 %   Monocytes Absolute 0.5  0.1 - 1.0 K/uL   Eosinophils Relative 3  0 - 5 %   Eosinophils Absolute 0.4  0.0 - 0.7 K/uL   Basophils  Relative 0  0 - 1 %   Basophils Absolute 0.0  0.0 - 0.1 K/uL  BASIC METABOLIC PANEL      Result Value Ref Range   Sodium 143  137 - 147 mEq/L   Potassium 4.0  3.7 - 5.3 mEq/L   Chloride 104  96 - 112 mEq/L   CO2 26  19 - 32 mEq/L   Glucose, Bld 89  70 - 99 mg/dL   BUN 12  6 - 23 mg/dL   Creatinine, Ser 2.840.62  0.50 - 1.10 mg/dL   Calcium 8.6  8.4 - 13.210.5 mg/dL   GFR calc non Af Amer >90  >90 mL/min   GFR calc Af Amer >90  >90 mL/min  PREGNANCY, URINE      Result Value Ref Range   Preg Test, Ur NEGATIVE  NEGATIVE  URINALYSIS, ROUTINE W REFLEX MICROSCOPIC      Result Value Ref Range   Color, Urine YELLOW  YELLOW   APPearance HAZY (*) CLEAR   Specific Gravity, Urine 1.024  1.005 - 1.030   pH 6.0  5.0 - 8.0   Glucose, UA NEGATIVE  NEGATIVE mg/dL   Hgb urine dipstick NEGATIVE  NEGATIVE   Bilirubin Urine NEGATIVE  NEGATIVE   Ketones, ur NEGATIVE  NEGATIVE mg/dL   Protein, ur NEGATIVE  NEGATIVE mg/dL   Urobilinogen, UA 1.0  0.0 - 1.0 mg/dL   Nitrite NEGATIVE  NEGATIVE   Leukocytes, UA NEGATIVE  NEGATIVE     MDM   Ellen Walter is a 38 y.o. female with a PMH of ADHD, asthma, bipolar disorder, and OCD who presents to the ED  for evaluation of a headache. Etiology of headaches possibly due to migraines. Head CT negative for any acute intracranial abnormalities. No focal neurological deficits on exam. Headache resolved throughout her ED visit. Patient afebrile and non-toxic in appearance. Vital signs stable. No meningeal signs or symptoms. Labs revealed mild leukocytosis (11.3) but were otherwise unremarkable. Patient instructed to drink fluids, rest, and follow-up with PCP. Return precautions, discharge instructions, and follow-up was discussed with the patient before discharge.    Rechecks  9:30 PM = Patient states her headache has completely resolved. States she is ready for discharge.      Discharge Medication List as of 02/17/2014  9:29 PM       Final impressions: 1. Headache      Greer EeJessica Katlin Corneisha Alvi PA-C            Jillyn LedgerJessica K Averi Cacioppo, PA-C 02/18/14 [phone]

## 2014-02-17 NOTE — Discharge Instructions (Signed)
Drink plenty of fluids (water) and rest! Keep a headache diary to find the trigger for your headaches - eat small frequent meals and try to get a good nights rest  Return to the emergency department if you develop any changing/worsening condition, fever, stiff neck, repeated vomiting, weakness, loss of sensation, confusion, facial drooping, difficulty walking/speaking, or any other concerns (please read additional information regarding your condition below)    Migraine Headache A migraine headache is an intense, throbbing pain on one or both sides of your head. A migraine can last for 30 minutes to several hours. CAUSES  The exact cause of a migraine headache is not always known. However, a migraine may be caused when nerves in the brain become irritated and release chemicals that cause inflammation. This causes pain. Certain things may also trigger migraines, such as:  Alcohol.  Smoking.  Stress.  Menstruation.  Aged cheeses.  Foods or drinks that contain nitrates, glutamate, aspartame, or tyramine.  Lack of sleep.  Chocolate.  Caffeine.  Hunger.  Physical exertion.  Fatigue.  Medicines used to treat chest pain (nitroglycerine), birth control pills, estrogen, and some blood pressure medicines. SIGNS AND SYMPTOMS  Pain on one or both sides of your head.  Pulsating or throbbing pain.  Severe pain that prevents daily activities.  Pain that is aggravated by any physical activity.  Nausea, vomiting, or both.  Dizziness.  Pain with exposure to bright lights, loud noises, or activity.  General sensitivity to bright lights, loud noises, or smells. Before you get a migraine, you may get warning signs that a migraine is coming (aura). An aura may include:  Seeing flashing lights.  Seeing bright spots, halos, or zig-zag lines.  Having tunnel vision or blurred vision.  Having feelings of numbness or tingling.  Having trouble talking.  Having muscle  weakness. DIAGNOSIS  A migraine headache is often diagnosed based on:  Symptoms.  Physical exam.  A CT scan or MRI of your head. These imaging tests cannot diagnose migraines, but they can help rule out other causes of headaches. TREATMENT Medicines may be given for pain and nausea. Medicines can also be given to help prevent recurrent migraines.  HOME CARE INSTRUCTIONS  Only take over-the-counter or prescription medicines for pain or discomfort as directed by your health care provider. The use of long-term narcotics is not recommended.  Lie down in a dark, quiet room when you have a migraine.  Keep a journal to find out what may trigger your migraine headaches. For example, write down:  What you eat and drink.  How much sleep you get.  Any change to your diet or medicines.  Limit alcohol consumption.  Quit smoking if you smoke.  Get 7 9 hours of sleep, or as recommended by your health care provider.  Limit stress.  Keep lights dim if bright lights bother you and make your migraines worse. SEEK IMMEDIATE MEDICAL CARE IF:   Your migraine becomes severe.  You have a fever.  You have a stiff neck.  You have vision loss.  You have muscular weakness or loss of muscle control.  You start losing your balance or have trouble walking.  You feel faint or pass out.  You have severe symptoms that are different from your first symptoms. MAKE SURE YOU:   Understand these instructions.  Will watch your condition.  Will get help right away if you are not doing well or get worse. Document Released: 08/24/2005 Document Revised: 06/14/2013 Document Reviewed: 05/01/2013 ExitCare Patient  Information 2014 [place], [place].  Recurrent Migraine Headache A migraine headache is an intense, throbbing pain on one or both sides of your head. Recurrent migraines keep coming back. A migraine can last for 30 minutes to several hours. CAUSES  The exact cause of a migraine headache is  not always known. However, a migraine may be caused when nerves in the brain become irritated and release chemicals that cause inflammation. This causes pain. Certain things may also trigger migraines, such as:   Alcohol.  Smoking.  Stress.  Menstruation.  Aged cheeses.  Foods or drinks that contain nitrates, glutamate, aspartame, or tyramine.  Lack of sleep.  Chocolate.  Caffeine.  Hunger.  Physical exertion.  Fatigue.  Medicines used to treat chest pain (nitroglycerine), birth control pills, estrogen, and some blood pressure medicines. SYMPTOMS   Pain on one or both sides of your head.  Pulsating or throbbing pain.  Severe pain that prevents daily activities.  Pain that is aggravated by any physical activity.  Nausea, vomiting, or both.  Dizziness.  Pain with exposure to bright lights, loud noises, or activity.  General sensitivity to bright lights, loud noises, or smells. Before you get a migraine, you may get warning signs that a migraine is coming (aura). An aura may include:  Seeing flashing lights.  Seeing bright spots, halos, or zig-zag lines.  Having tunnel vision or blurred vision.  Having feelings of numbness or tingling.  Having trouble talking.  Having muscle weakness. DIAGNOSIS  A recurrent migraine headache is often diagnosed based on:  Symptoms.  Physical examination.  A CT scan or MRI of your head. These imaging tests cannot diagnose migraines, but can help rule out other causes of headaches.  TREATMENT  Medicines may be given for pain and nausea. Medicines can also be given to help prevent recurrent migraines. HOME CARE INSTRUCTIONS  Only take over-the-counter or prescription medicines for pain or discomfort as directed by your health care provider. The use of long-term narcotics is not recommended.  Lie down in a dark, quiet room when you have a migraine.  Keep a journal to find out what may trigger your migraine headaches.  For example, write down:  What you eat and drink.  How much sleep you get.  Any change to your diet or medicines.  Limit alcohol consumption.  Quit smoking if you smoke.  Get 7 9 hours of sleep, or as recommended by your health care provider.  Limit stress.  Keep lights dim if bright lights bother you and make your migraines worse. SEEK MEDICAL CARE IF:   You do not get relief from the medicines given to you.  You have a recurrence of pain. SEEK IMMEDIATE MEDICAL CARE IF:  Your migraine becomes severe.  You have a fever.  You have a stiff neck.  You have loss of vision.  You have muscular weakness or loss of muscle control.  You start losing your balance or have trouble walking.  You feel faint or pass out.  You have severe symptoms that are different from your first symptoms. MAKE SURE YOU:   Understand these instructions.  Will watch your condition.  Will get help right away if you are not doing well or get worse. Document Released: 05/19/2001 Document Revised: 06/14/2013 Document Reviewed: 05/01/2013 [facility] Patient Information 2014 [place], [place].   Emergency Department Resource Guide 1) Find a Doctor and Pay Out of Pocket Although you won't have to find out who is covered by your insurance plan, it is  a good idea to ask around and get recommendations. You will then need to call the office and see if the doctor you have chosen will accept you as a new patient and what types of options they offer for patients who are self-pay. Some doctors offer discounts or will set up payment plans for their patients who do not have insurance, but you will need to ask so you aren't surprised when you get to your appointment.  2) Contact Your Local Health Department Not all health departments have doctors that can see patients for sick visits, but many do, so it is worth a call to see if yours does. If you don't know where your local health department is, you can check in  your phone book. The CDC also has a tool to help you locate your state's health department, and many state websites also have listings of all of their local health departments.  3) Find a Walk-in Clinic If your illness is not likely to be very severe or complicated, you may want to try a walk in clinic. These are popping up all over the country in pharmacies, drugstores, and shopping centers. They're usually staffed by nurse practitioners or physician assistants that have been trained to treat common illnesses and complaints. They're usually fairly quick and inexpensive. However, if you have serious medical issues or chronic medical problems, these are probably not your best option.  No Primary Care Doctor: - Call Health Connect at  [phone] - they can help you locate a primary care doctor that  accepts your insurance, provides certain services, etc. - Physician Referral Service- [phone]  Chronic Pain Problems: Organization         Address  Phone   Notes  [facility]  [phone] Patients need to be referred by their primary care doctor.   Medication Assistance: Organization         Address  Phone   Notes  [facility] Medication [facility] [address] [place], [place] [address] [phone] --Must be a resident of [facility] -- Must have NO insurance coverage whatsoever (no Medicaid/ Medicare, etc.) -- The pt. MUST have a primary care doctor that directs their care regularly and follows them in the community   MedAssist  [phone]   [employer]  [phone]    Agencies that provide inexpensive medical care: Organization         Address  Phone   Notes  [facility]  [phone]   [facility]    [phone]   [facility] [address] [place], [place] [address] [phone]   [facility] [address] [place], [place] [phone]    Planned Parenthood    [phone]   [facility]    [phone]   Community Health and [facility]  [address], [place] Phone:  [phone], Fax:  [phone] Hours of Operation:  9 am - 6 pm, M-F.  Also accepts Medicaid/Medicare and self-pay.  [facility]  [address], [place] Phone: [phone], Fax: [phone]. Hours of Operation:  8:30 am - 5:30 pm, M-F.  Also accepts Medicaid and self-pay.  [facility] [place] [address] [place] Phone: [phone]   [facility] [address] Natasha Bence [place], [place] [phone], Ext. [phone] Mondays & Thursdays: 7-9 AM.  First 15 patients are seen on a first come, first serve basis.    Medicaid-accepting [facility] Providers:  Organization         Address  Phone   Notes  [facility] [address], [place] [phone]) [phone] Also accepts self-pay patients.  [facility] [address], [place]  [phone]) [phone]   [facility] [address], [place] [phone]) [phone]   [facility] [address] [place] [address], [place] [phone]) [phone]   Renaye RakersVeita Bland [address], [place]   [phone] Only accepts [place] Access [place] patients after they have their name applied to their card.   Self-Pay (no insurance) in [facility]:  Organization         Address  Phone   Notes  Sickle Cell Patients, [facility] [address], [place] [phone]) [phone]   [facility] [address], [place] [phone]) [phone]   [facility] [place]  [address], [place] [phone]) [phone]   [facility]/Dr. Osei-Bonsu  [address] [place] [address], [place] or [phone], [place] [phone] Phone number for both [place] and [place] locations is the  same.  [facility] [address], [place] [phone]) [phone]   [facility] [address] [place] [address], [place] or [address] [phone]) [phone]   [facility] [address], [place] [phone]) [phone], phone; [phone]) [phone], fax Sees patients 1st and 3rd Saturday of every month.  Must not qualify for public or private insurance (i.e. Medicaid, Medicare, [place] Health Choice, Veterans' Benefits)  Household income should be no more than 200% of the poverty level The clinic cannot treat you if you are pregnant or think you are pregnant  Sexually transmitted diseases are not treated at the clinic.    Dental Care: Organization         Address  Phone  Notes  [facility] Department of [facility] [address], [place] [phone]) [phone] Accepts children up to age 39 who are enrolled in [place]icaid or [place] Health Choice; pregnant women with a Medicaid card; and children who have applied for Medicaid or [place] Health Choice, but were declined, whose parents can pay a reduced fee at time of service.  [facility] of [facility] [place]  [address], [place] [phone]) [phone] Accepts children up to age 39 who are enrolled in [place] or [place] Health Choice; pregnant women with a Medicaid card; and children who have applied for Medicaid or [place] Health Choice, but were declined, whose parents can pay a reduced fee at time of service.  [facility]  [address], [place] [phone]) [phone] Patients are seen by appointment only. Walk-ins are not accepted. [facility] will see patients 39 years of age and older. Monday - Tuesday (8am-5pm) Most Wednesdays (8:30-5pm) $30 per visit, cash only  [facility]  [address], [facility] [phone]) [phone] Patients are seen by appointment only. Walk-ins are not accepted. [facility] will see patients  39 years of age and older. One Wednesday Evening (Monthly: Volunteer Based).  $30 per visit, cash only  [employer] [facility]  [phone] for adults; Children under age 414, call Graduate  Pediatric Dentistry at [phone]. Children aged 10-14, please call [phone] to request a pediatric application.  Dental services are provided in all areas of dental care including fillings, crowns and bridges, complete and partial dentures, implants, gum treatment, root canals, and extractions. Preventive care is also provided. Treatment is provided to both adults and children. Patients are selected via a lottery and there is often a waiting list.   [facility] [address], [place]  [phone] [email or web address]   [facility] [address] [place], [place] [phone], Ext. [phone] Second and Fourth Thursday of each month, opens at 6:30 AM; Clinic ends at 9 AM.  Patients are seen on a first-come first-served basis, and a limited number are seen during each clinic.   [facility]  [address] [place], [place] [phone]   Eligibility Requirements You must have lived in [place], [place], or [place] counties for at least the last three months.   You cannot be eligible for state or federal sponsored [place], including [employer], [place], or [employer].   You generally cannot be eligible for healthcare insurance through your employer.    How to apply: Eligibility screenings are held every Tuesday and Wednesday afternoon from 1:00 pm until 4:00 pm. You do not need an appointment for the interview!  [facility] [address] [place], [place] [phone]   [facility] Health Department  [phone]   [facility] Health Department  [phone]   [facility]  [phone]    Behavioral Health Resources in the Community: Intensive Outpatient  Programs Organization         Address  Phone  Notes  [facility] [address] [place], [place] [phone]   [facility] [address] [place], [place] [phone]   [facility] [address], [place], [place]  [phone]   [facility] [address],  [place], [place] [phone] or [phone]   Substance Abuse Resources Organization         Address  Phone  Notes  [facility]  [phone]   [facility]  [phone]   [facility] [place]  [phone]   Floydene Flock  [phone]   Residential & Outpatient Substance Abuse Program  [phone]   Psychological Services Organization         Address  Phone  Notes  [facility]al Health  [phone]   [facility] Services  [phone]   [facility] [address], [place] [phone] or [phone]    Mobile Crisis Teams Organization         Address  Phone  Notes  [facility], Mobile Crisis Care Unit  [phone]   [facility]  [address] [place], [place] [phone]   Doristine Locks [address] [place] [phone]    Self-Help/Support Groups Organization         Address  Phone             Notes  [facility] of [place] - variety of support groups  [phone]- I7437963 Call for more information  Narcotics Anonymous (NA), [facility] [address], [employer] [place]  2 meetings at this location   Statistician         Address  Phone  Notes  [facility] [address] [place],  [place]  [phone]   [facility]  [address], [place] [phone], [place], [place] [phone]   [facility] [address], [place] [phone] Admissions: 8am-3pm M-F  [facility] [address]    [place], [place]  [phone]   [facility] [address], [place], [place] [phone]   [facility] [address]  [place], [place] [phone]   [facility] - Intensive Outpatient [address], [place], [place] [phone]   [facility] ([facility] [address]  [place], [place] [phone] or [phone]   [facility] ([facility]) [address] [place], [place] [phone] Accepts Medicaid  [facility] [address]  [place] [phone] Substance Abuse/Addiction Treatment   [facility] Behavioral Health Resources Organization         Address  Phone  Notes  [facility]  [phone]) [phone]   Angie FavaJulie Brannon, PhD [address] [place], [place]   [phone]) [phone] or [phone]   [facility]   [address] [place], [place] [phone](336) [phone]   [facility] [address], [place], [place] [phone]) [phone] Insurance/Medicaid/sponsorship through [facility]  [facility] Families [address]                                    [place], [place] [phone]) [phone] Therapy/tele-psych/case  [facility] [address].   [place], [place] [phone]) [phone]    Dr. Lolly MustacheArfeen  [phone](336) [phone]   [facility] [place]  [employer] [facility] 1) [address], [place] 2) [address], [place] 3)  [address], [place] [phone]) [phone](336) [phone]   [facility] Child Abuse Hotline [phone]) [phone] or [phone]) [phone] (After Hours)

## 2014-02-17 NOTE — ED Notes (Signed)
Pt states she was seen here for the same on the 4th, "worse than a migrane" states they did nothing from her, states no labs or tests were run. Headache for 6 months but the last three weeks states it has become unbearable.

## 2014-02-17 NOTE — ED Notes (Signed)
Pt reports headache for 6 months, it is constant. Reports past few weeks, the pain is behind her eyes and reports blurry vision. No neuro deficits. Pt is a x 4. Denies injury to head. Reports nothing makes it better.

## 2014-02-22 NOTE — ED Provider Notes (Signed)
Medical screening examination/treatment/procedure(s) were performed by non-physician practitioner and as supervising physician I was immediately available for consultation/collaboration.   EKG Interpretation None        Milton Sagona H Samul Mcinroy, MD 02/22/14 0701 

## 2015-10-16 IMAGING — CT CT HEAD W/O CM
1 series · 16 of 29 positions shown, 20 images · non-contrast
Comparison: None.

CLINICAL DATA: Chronic headaches x6 months

EXAM:
CT HEAD WITHOUT CONTRAST
TECHNIQUE: Contiguous axial images were obtained from the base of the skull
through the vertex without intravenous contrast.

[Series 2: head 5.0 h30s · axial · 0.39mm/px · z∈[-81,+49]mm · 16 of 29 slices shown, 20 images]
[im 2/29  brain]
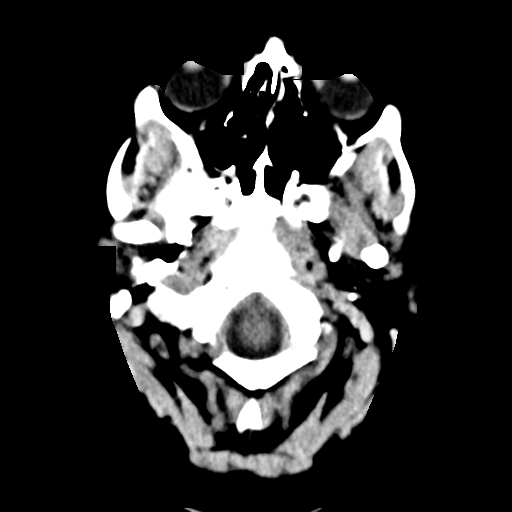
[im 2/29  bone]
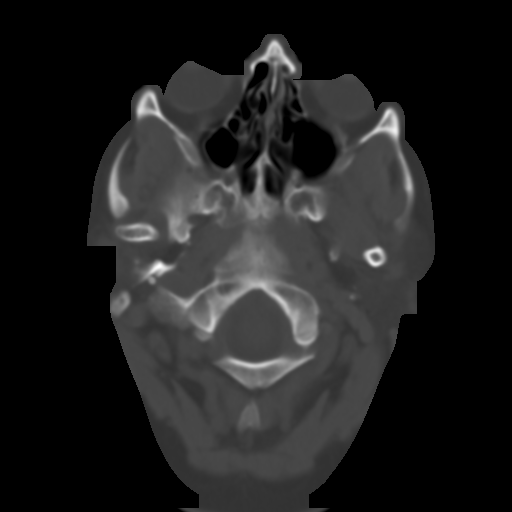
[im 4/29  brain]
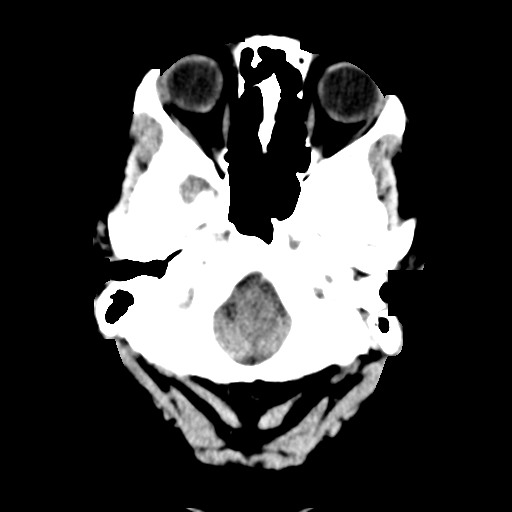
[im 6/29  brain]
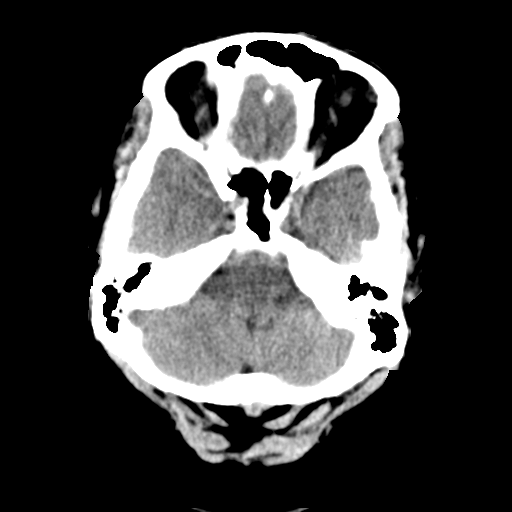
[im 7/29  brain]
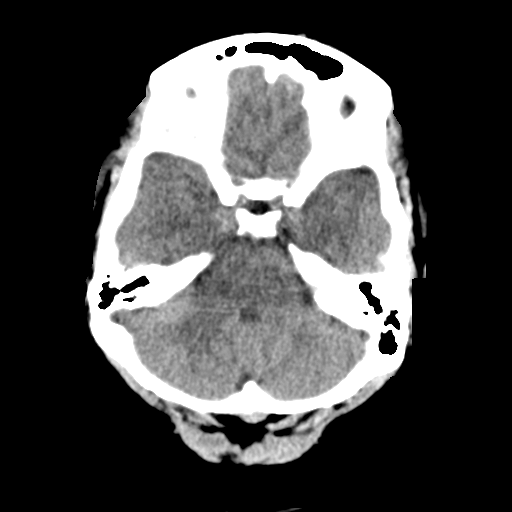
[im 9/29  brain]
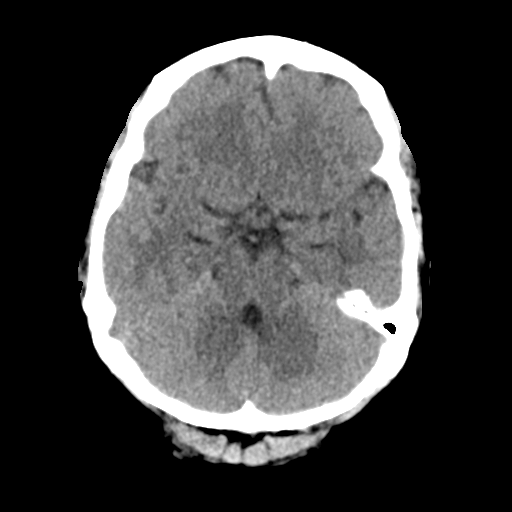
[im 9/29  bone]
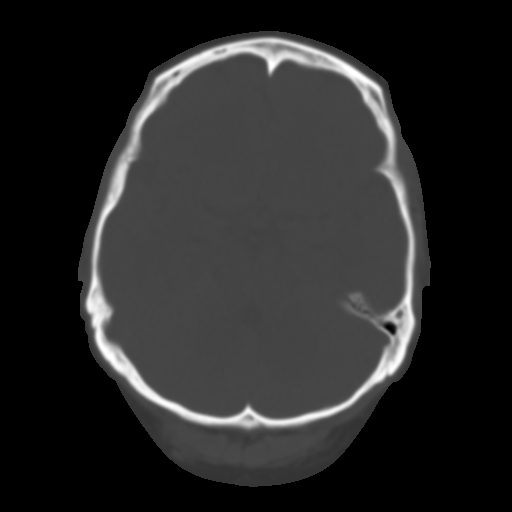
[im 11/29  brain]
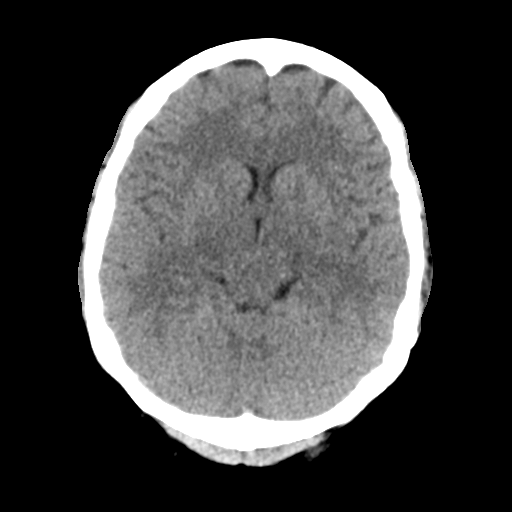
[im 12/29  brain]
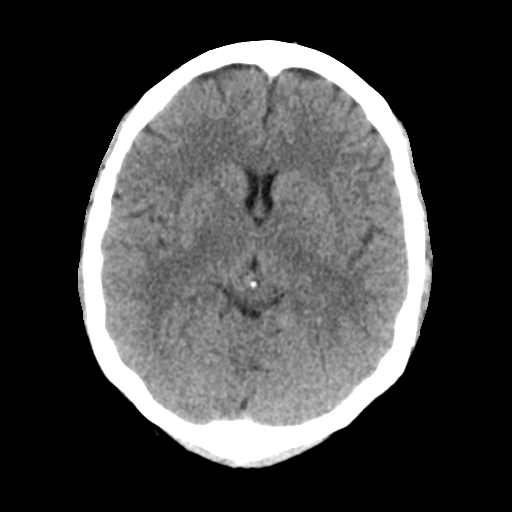
[im 14/29  brain]
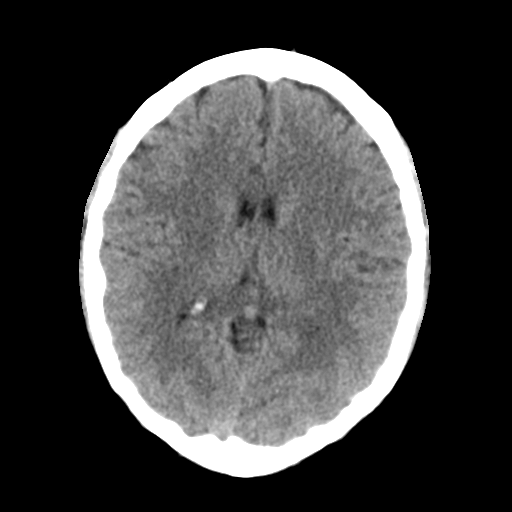
[im 16/29  brain]
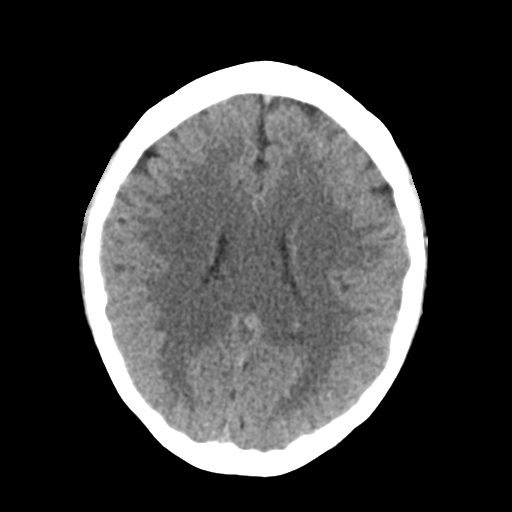
[im 16/29  bone]
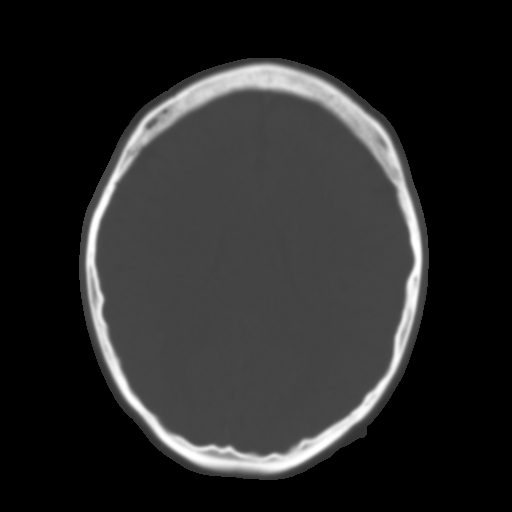
[im 18/29  brain]
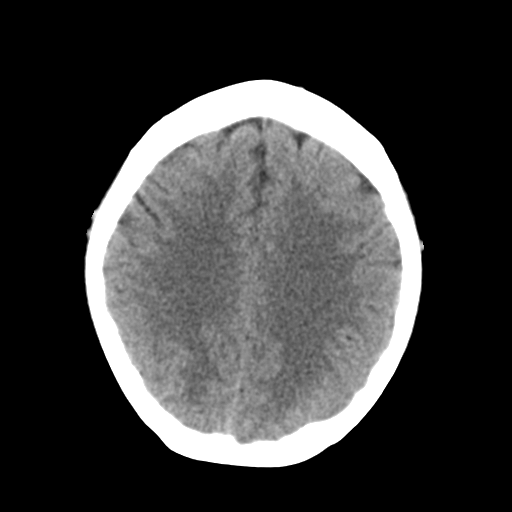
[im 19/29  brain]
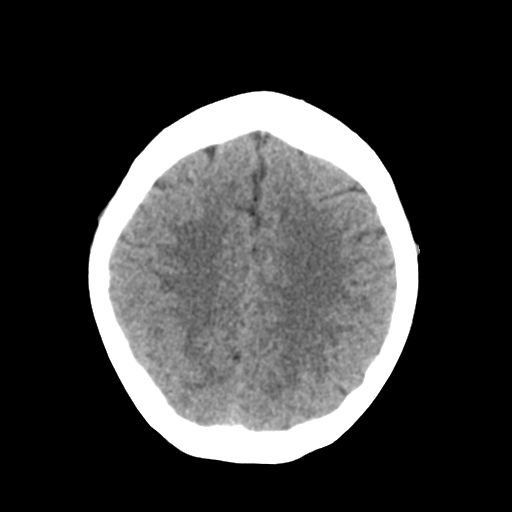
[im 21/29  brain]
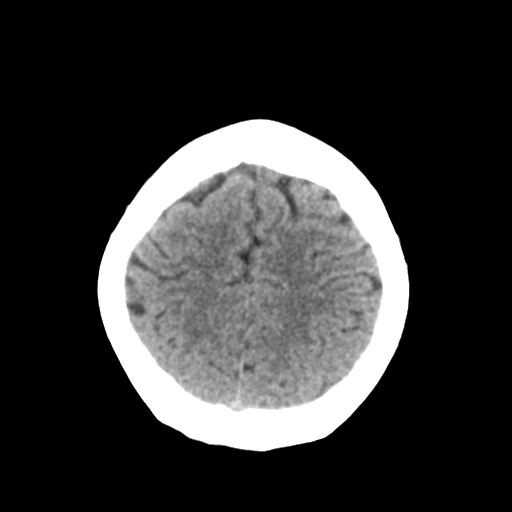
[im 23/29  brain]
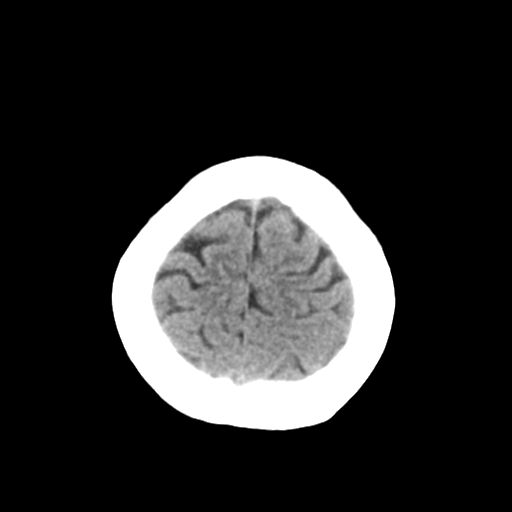
[im 23/29  bone]
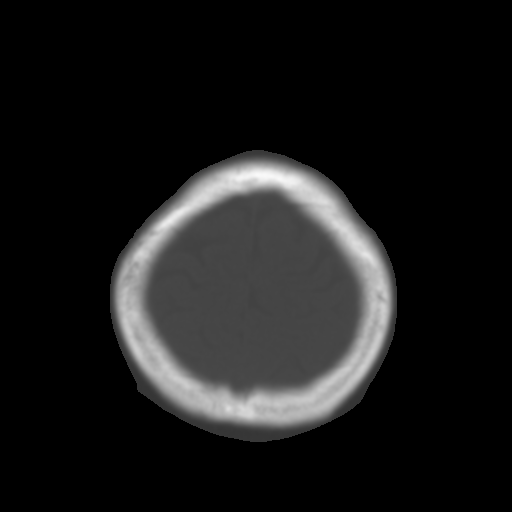
[im 24/29  brain]
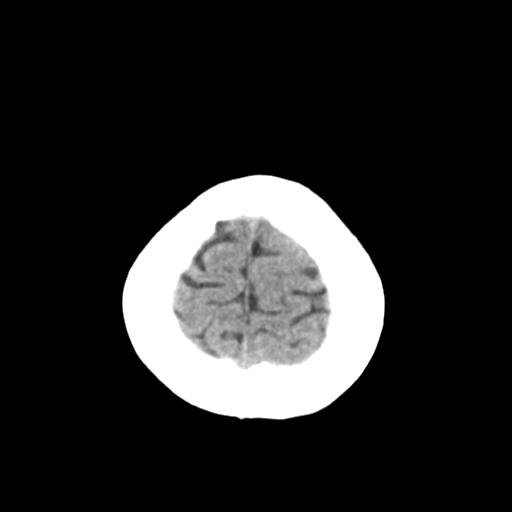
[im 26/29  brain]
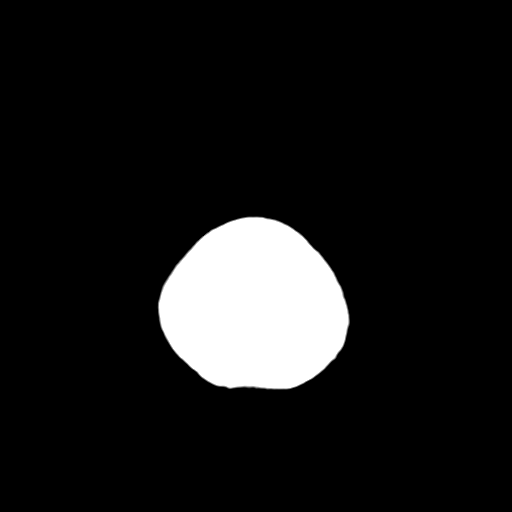
[im 28/29  brain]
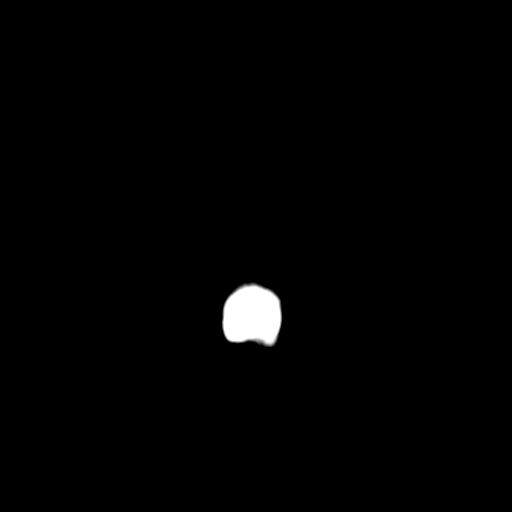

[16 of 29 positions shown; findings below may reference images not displayed]

FINDINGS: No evidence of parenchymal hemorrhage or extra-axial fluid
collection. No mass lesion, mass effect, or midline shift.

No CT evidence of acute infarction.

Cerebral volume is within normal limits.  No ventriculomegaly.

The visualized paranasal sinuses are essentially clear. The mastoid
air cells are unopacified.

No evidence of calvarial fracture.
IMPRESSION: Normal head CT.
